# Patient Record
Sex: Female | Born: 1980 | Race: White | Hispanic: No | Marital: Married | Smoking: Never smoker
Health system: Southern US, Community
[De-identification: ages and names within clinical notes are randomized; demographics above are authoritative.]

## PROBLEM LIST (undated history)

## (undated) HISTORY — PX: CHOLECYSTECTOMY: SHX55

---

## 2017-08-05 ENCOUNTER — Emergency Department: Payer: TRICARE For Life (TFL)

## 2017-08-05 ENCOUNTER — Other Ambulatory Visit: Payer: Self-pay

## 2017-08-05 ENCOUNTER — Emergency Department
Admission: EM | Admit: 2017-08-05 | Discharge: 2017-08-05 | Disposition: A | Discharge status: Home / Self Care | Payer: TRICARE For Life (TFL) | Attending: Emergency Medicine | Admitting: Emergency Medicine

## 2017-08-05 DIAGNOSIS — R0789 Other chest pain: Secondary | ICD-10-CM

## 2017-08-05 DIAGNOSIS — R079 Chest pain, unspecified: Secondary | ICD-10-CM | POA: Diagnosis present

## 2017-08-05 LAB — POCT PREGNANCY, URINE: Preg Test, Ur: NEGATIVE

## 2017-08-05 LAB — BASIC METABOLIC PANEL
Anion gap: 7 (ref 5–15)
BUN: 8 mg/dL (ref 6–20)
CHLORIDE: 104 mmol/L (ref 101–111)
CO2: 26 mmol/L (ref 22–32)
CREATININE: 0.76 mg/dL (ref 0.44–1.00)
Calcium: 8.9 mg/dL (ref 8.9–10.3)
GFR calc Af Amer: >60 mL/min (ref >60)
GFR calc non Af Amer: >60 mL/min (ref >60)
GLUCOSE: 112 mg/dL — AB (ref 65–99)
POTASSIUM: 4.1 mmol/L (ref 3.5–5.1)
SODIUM: 137 mmol/L (ref 135–145)

## 2017-08-05 LAB — CBC
HEMATOCRIT: 38.6 % (ref 35.0–47.0)
Hemoglobin: 13.2 g/dL (ref 12.0–16.0)
MCH: 29.3 pg (ref 26.0–34.0)
MCHC: 34.2 g/dL (ref 32.0–36.0)
MCV: 85.7 fL (ref 80.0–100.0)
PLATELETS: 257 10*3/uL (ref 150–440)
RBC: 4.5 MIL/uL (ref 3.80–5.20)
RDW: 12.9 % (ref 11.5–14.5)
WBC: 5 10*3/uL (ref 3.6–11.0)

## 2017-08-05 LAB — TROPONIN I: Troponin I: <0.03 ng/mL (ref <0.03)

## 2017-08-05 NOTE — ED Provider Notes (Signed)
[facility] Emergency Department Provider Note ____________________________________________   First MD Initiated Contact with Patient 08/05/17 1208     (approximate)  I have reviewed the triage vital signs and the nursing notes.   HISTORY  Chief Complaint Chest Pain    HPI Jessica May is a 37 y.o. female with PMH as noted below as well as a history of GERD who presents with chest pain, described as tightness across her chest, and sometimes with a palpitation or feeling of skipped heartbeat.  She states that sometimes she gets a feeling like a bubble going up into her throat.  It is worsened after eating, and she feels in the morning after she has been lying down.  She states that she has had it for a few months intermittently, but is been worse in the last several days.  She denies associated shortness of breath, weakness or lightheadedness, fever, cough, or difficulty swallowing.  She states she has been compliant with her Zantac and Protonix for her GERD.   History reviewed. No pertinent past medical history.  There are no active problems to display for this patient.   Past Surgical History:  Procedure Laterality Date  . CHOLECYSTECTOMY      Prior to Admission medications   Not on File    Allergies Patient has no known allergies.  No family history on file.  Social History Social History   Tobacco Use  . Smoking status: Never Smoker  . Smokeless tobacco: Never Used  Substance Use Topics  . Alcohol use: Not Currently  . Drug use: Not Currently    Review of Systems  Constitutional: No fever. Eyes: No redness. ENT: No sore throat. Cardiovascular: Positive for chest pain. Respiratory: Denies shortness of breath. Gastrointestinal: No nausea, no vomiting.   Genitourinary: Negative for flank pain.  Musculoskeletal: Negative for back pain. Skin: Negative for rash. Neurological: Negative for  headache.   ____________________________________________   PHYSICAL EXAM:  VITAL SIGNS: ED Triage Vitals  Enc Vitals Group     BP 08/05/17 0921 114/77     Pulse Rate 08/05/17 0921 71     Resp 08/05/17 0921 16     Temp 08/05/17 0921 98.9 F (37.2 C)     Temp Source 08/05/17 0921 Oral     SpO2 08/05/17 0921 97 %     Weight 08/05/17 0922 150 lb (68 kg)     Height 08/05/17 0922  (1.6 m)     Head Circumference --      Peak Flow --      Pain Score 08/05/17 0922 2     Pain Loc --      Pain Edu? --      Excl. in GC? --     Constitutional: Alert and oriented. Well appearing and in no acute distress. Eyes: Conjunctivae are normal.  Head: Atraumatic. Nose: No congestion/rhinnorhea. Mouth/Throat: Mucous membranes are moist.   Neck: Normal range of motion.  Cardiovascular: Normal rate, regular rhythm. Grossly normal heart sounds.  Good peripheral circulation. Respiratory: Normal respiratory effort.  No retractions. Lungs CTAB. Gastrointestinal: Soft and nontender. No distention.  Genitourinary: No flank tenderness. Musculoskeletal: No lower extremity edema.  No calf or popliteal swelling or tenderness.  Extremities warm and well perfused.  Neurologic:  Normal speech and language. No gross focal neurologic deficits are appreciated.  Skin:  Skin is warm and dry. No rash noted. Psychiatric: Mood and affect are normal. Speech and behavior are normal.  ____________________________________________  LABS (all labs ordered are listed, but only abnormal results are displayed)  Labs Reviewed  BASIC METABOLIC PANEL - Abnormal; Notable for the following components:      Result Value   Glucose, Bld 112 (*)    All other components within normal limits  CBC  TROPONIN I  POCT PREGNANCY, URINE  POC URINE PREG, ED   ____________________________________________  EKG  ED ECG REPORT I, Dionne Bucy, the attending physician, personally viewed and interpreted this ECG.  Date:  08/05/2017 EKG Time: 907 Rate: 73 Rhythm: normal sinus rhythm QRS Axis: Left axis Intervals: normal ST/T Wave abnormalities: normal Narrative Interpretation: no evidence of acute ischemia; no significant change when compared to prior EKG from last year the patient had a picture of on her phone  ____________________________________________  RADIOLOGY  CXR: No focal infiltrate or other acute findings  ____________________________________________   PROCEDURES  Procedure(s) performed: No  Procedures  Critical Care performed: No ____________________________________________   INITIAL IMPRESSION / ASSESSMENT AND PLAN / ED COURSE  Pertinent labs & imaging results that were available during my care of the patient were reviewed by me and considered in my medical decision making (see chart for details).  37 year old female with PMH as noted above presents with atypical chest pain which is sometimes associated with palpitations, or with a feeling of a "bubble" in her throat.  It seems to be somewhat worse with eating and after lying flat.  On exam, the patient is extremely well-appearing, vital signs are normal, and the remainder the exam is unremarkable.  EKG has a few nonspecific abnormalities but is identical to her prior EKG she showed me from her phone (patient is new to the area and has no prior records here).  Overall presentation is most consistent with the patient's GERD, versus gastritis or PUD.  There is no evidence of cardiac etiology and patient has no ACS risk factors.  She is PERC negative.  Basic labs are unremarkable.  Given the patient's low risk and the duration of the symptoms, single negative troponin is sufficient for ACS rule out.  The patient feels comfortable to go home.  At this time I would not recommend any acute changes in her GI medications, but I advised her on diet and we will provide a GI referral here.  The patient agrees with this plan.  Return  precautions given, and she expressed understanding.      ____________________________________________   FINAL CLINICAL IMPRESSION(S) / ED DIAGNOSES  Final diagnoses:  Atypical chest pain      NEW MEDICATIONS STARTED DURING THIS VISIT:  New Prescriptions   No medications on file     Note:  This document was prepared using Dragon voice recognition software and may include unintentional dictation errors.    Dionne Bucy, MD 08/05/17 1240

## 2017-08-05 NOTE — Discharge Instructions (Addendum)
Continue to take your GERD medications as prescribed and watch your diet.  We have provided you a referral to follow-up with one of our gastroenterologists.  Return to the ER for new, worsening, or constant pain, difficulty breathing, weakness or lightheadedness, difficulty swallowing, fevers, or any other new or worsening symptoms that concern you.

## 2017-08-05 NOTE — ED Notes (Signed)
AAOx3.  Skin warm and dry.  NAD 

## 2017-08-05 NOTE — ED Triage Notes (Signed)
Pt c/o chest tightness with feeling like an air bubble is stuck in her chest for the past 7-10 days. Denies cough or congestion. Denies injury. Pt is in NAD at present.

## 2019-09-06 IMAGING — CR DG CHEST 2V
1 series · 2 of 2 positions shown · non-contrast
Comparison: None.

CLINICAL DATA: Chest tightness. Abnormal sensation in the chest,
7-10 days duration.

EXAM:
CHEST - 2 VIEW

[Series 1: dg chest 2 view · 0.14mm/px · 2 of 2 slices shown]
[im 1/2]
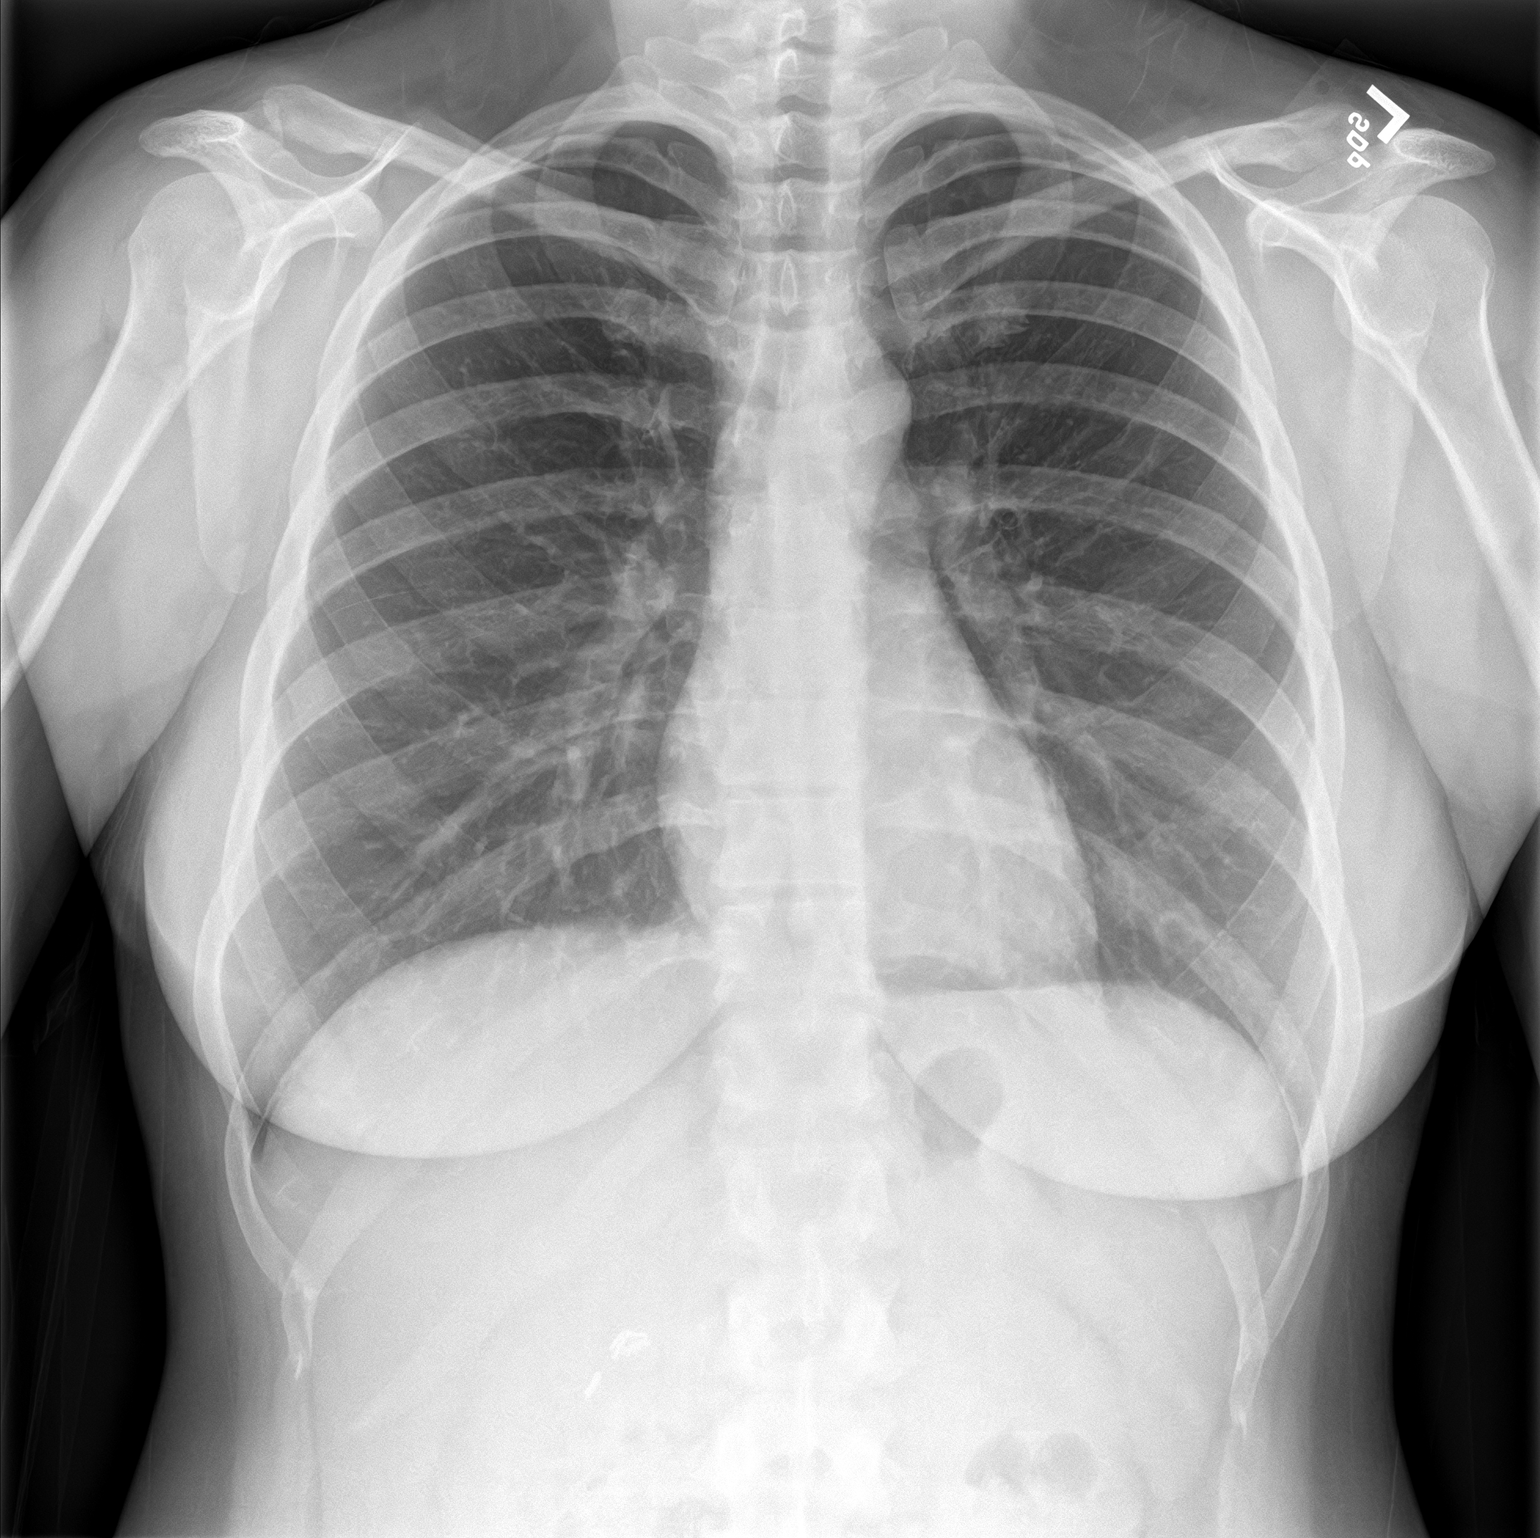
[im 2/2]
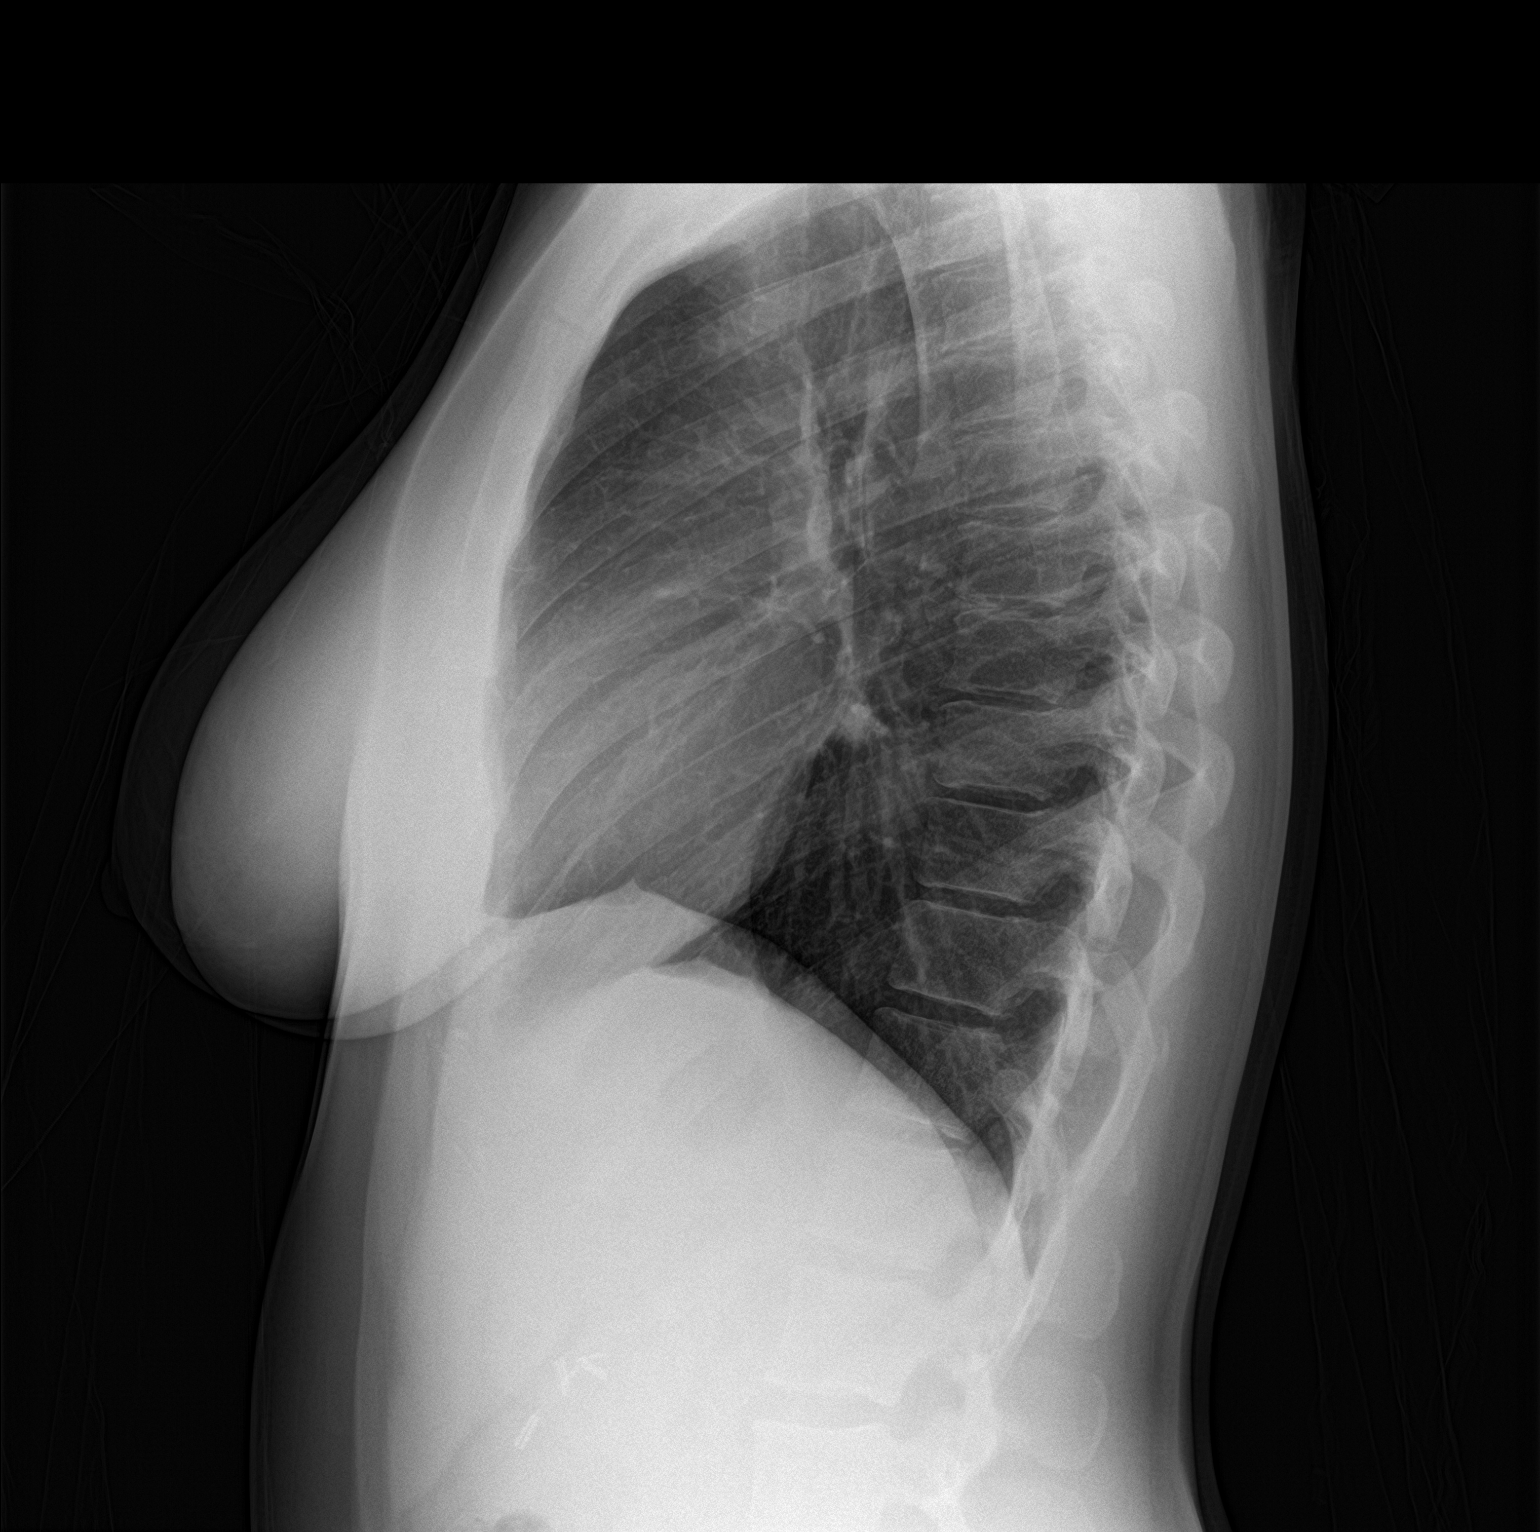

[2 of 2 positions shown; findings below may reference images not displayed]

FINDINGS: Heart size is normal. Mediastinal shadows are normal. The lungs are
clear. No bronchial thickening. No infiltrate, mass, effusion or
collapse. Pulmonary vascularity is normal. No bony abnormality.
IMPRESSION: Normal chest
